# Patient Record
Sex: Female | Born: 1995 | Race: White | Hispanic: Yes | Marital: Single | Smoking: Never smoker
Health system: Southern US, Community
[De-identification: ages and names within clinical notes are randomized; demographics above are authoritative.]

---

## 2018-07-14 ENCOUNTER — Encounter: Payer: Self-pay | Admitting: Emergency Medicine

## 2018-07-14 ENCOUNTER — Other Ambulatory Visit: Payer: Self-pay

## 2018-07-14 ENCOUNTER — Emergency Department
Admission: EM | Admit: 2018-07-14 | Discharge: 2018-07-14 | Disposition: A | Discharge status: Home / Self Care | Payer: Commercial Managed Care - PPO | Attending: Emergency Medicine | Admitting: Emergency Medicine

## 2018-07-14 ENCOUNTER — Emergency Department: Payer: Commercial Managed Care - PPO

## 2018-07-14 DIAGNOSIS — Y92321 Football field as the place of occurrence of the external cause: Secondary | ICD-10-CM | POA: Insufficient documentation

## 2018-07-14 DIAGNOSIS — Y998 Other external cause status: Secondary | ICD-10-CM | POA: Insufficient documentation

## 2018-07-14 DIAGNOSIS — W03XXXA Other fall on same level due to collision with another person, initial encounter: Secondary | ICD-10-CM | POA: Insufficient documentation

## 2018-07-14 DIAGNOSIS — S93401A Sprain of unspecified ligament of right ankle, initial encounter: Secondary | ICD-10-CM | POA: Insufficient documentation

## 2018-07-14 DIAGNOSIS — S99911A Unspecified injury of right ankle, initial encounter: Secondary | ICD-10-CM | POA: Diagnosis present

## 2018-07-14 DIAGNOSIS — Y9362 Activity, american flag or touch football: Secondary | ICD-10-CM | POA: Diagnosis not present

## 2018-07-14 MED ORDER — HYDROCODONE-ACETAMINOPHEN 5-325 MG PO TABS
1.0000 | ORAL_TABLET | Freq: Once | ORAL | Status: AC
  Administered 2018-07-14: 1 via ORAL
  Filled 2018-07-14: qty 1

## 2018-07-14 MED ORDER — HYDROCODONE-ACETAMINOPHEN 5-325 MG PO TABS: 1.0000 | ORAL_TABLET | Freq: Four times a day (QID) | ORAL | 0 refills | Status: AC | PRN

## 2018-07-14 NOTE — ED Provider Notes (Signed)
[facility] Emergency Department Provider Note   ____________________________________________   First MD Initiated Contact with Patient 07/14/18 [phone]     (approximate)  I have reviewed the triage vital signs and the nursing notes.   HISTORY  Chief Complaint Ankle Pain    HPI Jade Wong is a 22 y.o. female who presents to the ED from [facility] with a chief complaint of right ankle pain and injury.  Patient was playing flag football when she got tackled from the side yesterday proximally 5 PM.  Larey Seat, injuring her right ankle.  Denies extremity weakness, numbness or tingling.  Also complains of right jaw pain.  Denies striking head or LOC.  Denies neck pain, chest pain, shortness of breath, abdominal pain, nausea or vomiting.  Denies anticoagulant use.   Past medical history None  There are no active problems to display for this patient.   History reviewed. No pertinent surgical history.  Prior to Admission medications   Medication Sig Start Date End Date Taking? Authorizing Provider  HYDROcodone-acetaminophen (NORCO) 5-325 MG tablet Take 1 tablet by mouth every 6 (six) hours as needed for moderate pain. 07/14/18   Irean Hong, MD    Allergies Nsaids  No family history on file.  Social History Social History   Tobacco Use  . Smoking status: Never Smoker  Substance Use Topics  . Alcohol use: Yes  . Drug use: Not Currently    Review of Systems  Constitutional: No fever/chills Eyes: No visual changes. ENT: Positive for right jaw pain.  No sore throat. Cardiovascular: Denies chest pain. Respiratory: Denies shortness of breath. Gastrointestinal: No abdominal pain.  No nausea, no vomiting.  No diarrhea.  No constipation. Genitourinary: Negative for dysuria. Musculoskeletal: Positive for right ankle pain and injury.  Negative for back pain. Skin: Negative for rash. Neurological: Negative for headaches, focal weakness or  numbness.   ____________________________________________   PHYSICAL EXAM:  VITAL SIGNS: ED Triage Vitals  Enc Vitals Group     BP 07/14/18 0207 126/66     Pulse Rate 07/14/18 0207 99     Resp 07/14/18 0207 20     Temp 07/14/18 0207 97.8 F (36.6 C)     Temp Source 07/14/18 0207 Oral     SpO2 07/14/18 0207 97 %     Weight 07/14/18 0208 140 lb (63.5 kg)     Height 07/14/18 0208 5' 8.5" (1.74 m)     Head Circumference --      Peak Flow --      Pain Score 07/14/18 0208 6     Pain Loc --      Pain Edu? --      Excl. in GC? --     Constitutional: Alert and oriented. Well appearing and in no acute distress.  Laughing with friend who is at bedside. Eyes: Conjunctivae are normal. PERRL. EOMI. Head: Atraumatic. Nose: Atraumatic. Mouth/Throat: Mucous membranes are moist.  No dental malocclusion.  No obvious jaw injury.  No trismus.  Patient is able to fully open and close her mouth. Neck: No stridor.  No cervical spine tenderness to palpation. Cardiovascular: Normal rate, regular rhythm. Grossly normal heart sounds.  Good peripheral circulation. Respiratory: Normal respiratory effort.  No retractions. Lungs CTAB. Gastrointestinal: Soft and nontender. No distention. No abdominal bruits. No CVA tenderness. Musculoskeletal:  Right ankle: Anterior tender to palpation.  Limited range of motion secondary to pain.  2+ distal pulses.  Brisk, less than 5-second capillary refill. Neurologic:  Normal speech and language. No gross focal neurologic deficits are appreciated.  Skin:  Skin is warm, dry and intact. No rash noted. Psychiatric: Mood and affect are normal. Speech and behavior are normal.  ____________________________________________   LABS (all labs ordered are listed, but only abnormal results are displayed)  Labs Reviewed - No data to display ____________________________________________  EKG  None ____________________________________________  RADIOLOGY  ED MD  interpretation: No acute traumatic injuries  Official radiology report(s): Dg Ankle Complete Right  Result Date: 07/14/2018 CLINICAL DATA:  Acute on set of medial right anterior ankle pain after tackling injury while playing football. Initial encounter. EXAM: RIGHT ANKLE - COMPLETE 3+ VIEW COMPARISON:  None. FINDINGS: There is no evidence of fracture or dislocation. The ankle mortise is intact; the interosseous space is within normal limits. No talar tilt or subluxation is seen. An os peroneum is noted. The joint spaces are preserved. No significant soft tissue abnormalities are seen. IMPRESSION: 1. No evidence of fracture or dislocation. 2. Os peroneum noted. Electronically Signed   By: Roanna Raider M.D.   On: 07/14/2018 02:49    ____________________________________________   PROCEDURES  Procedure(s) performed: None  Procedures  Critical Care performed: No  ____________________________________________   INITIAL IMPRESSION / ASSESSMENT AND PLAN / ED COURSE  As part of my medical decision making, I reviewed the following data within the electronic MEDICAL RECORD NUMBER Nursing notes reviewed and incorporated, Radiograph reviewed and Notes from prior ED visits   22 year old female who presents 10 hours status post after flag football injury with right ankle and jaw pain.  Clinically patient does not appear to have jaw fracture dislocation.  However, I did offer CT maxillofacial which patient declines.  Will administer Norco; pending x-ray results of right ankle.  Clinical Course as of Jul 15 427  Sat Jul 14, 2018  0254 Updated patient on x-ray result.  Will place in air splint, crutches, prescription for analgesia and orthopedics follow-up.  Strict return precautions given.  Patient verbalizes understanding and agrees with plan of care.   [JS]    Clinical Course User Index [JS] Irean Hong, MD     ____________________________________________   FINAL CLINICAL IMPRESSION(S) / ED  DIAGNOSES  Final diagnoses:  Sprain of right ankle, unspecified ligament, initial encounter     ED Discharge Orders         Ordered    HYDROcodone-acetaminophen (NORCO) 5-325 MG tablet  Every 6 hours PRN     07/14/18 0256           Note:  This document was prepared using Dragon voice recognition software and may include unintentional dictation errors.    Irean Hong, MD 07/14/18 [phone]

## 2018-07-14 NOTE — ED Triage Notes (Signed)
Pt reports about 5pm yesterday afternoon she was playing football and got tackled reports left ankle pain and jaw discomfort, pt talks in complete sentences no distress noted.

## 2018-07-14 NOTE — Discharge Instructions (Addendum)
1.  You may take Norco as needed for pain. 2.  Wear splint and crutches as needed for comfort.  Elevate affected ankle and apply ice several times daily over the weekend. 3.  Return to the ER for worsening symptoms, persistent vomiting, difficulty breathing or other concerns.

## 2020-06-02 IMAGING — DX DG ANKLE COMPLETE 3+V*R*
3 series · 3 of 3 positions shown · non-contrast
Comparison: None.

CLINICAL DATA: Acute on set of medial right anterior ankle pain
after tackling injury while playing football. Initial encounter.

EXAM:
RIGHT ANKLE - COMPLETE 3+ VIEW

[ankle ap]
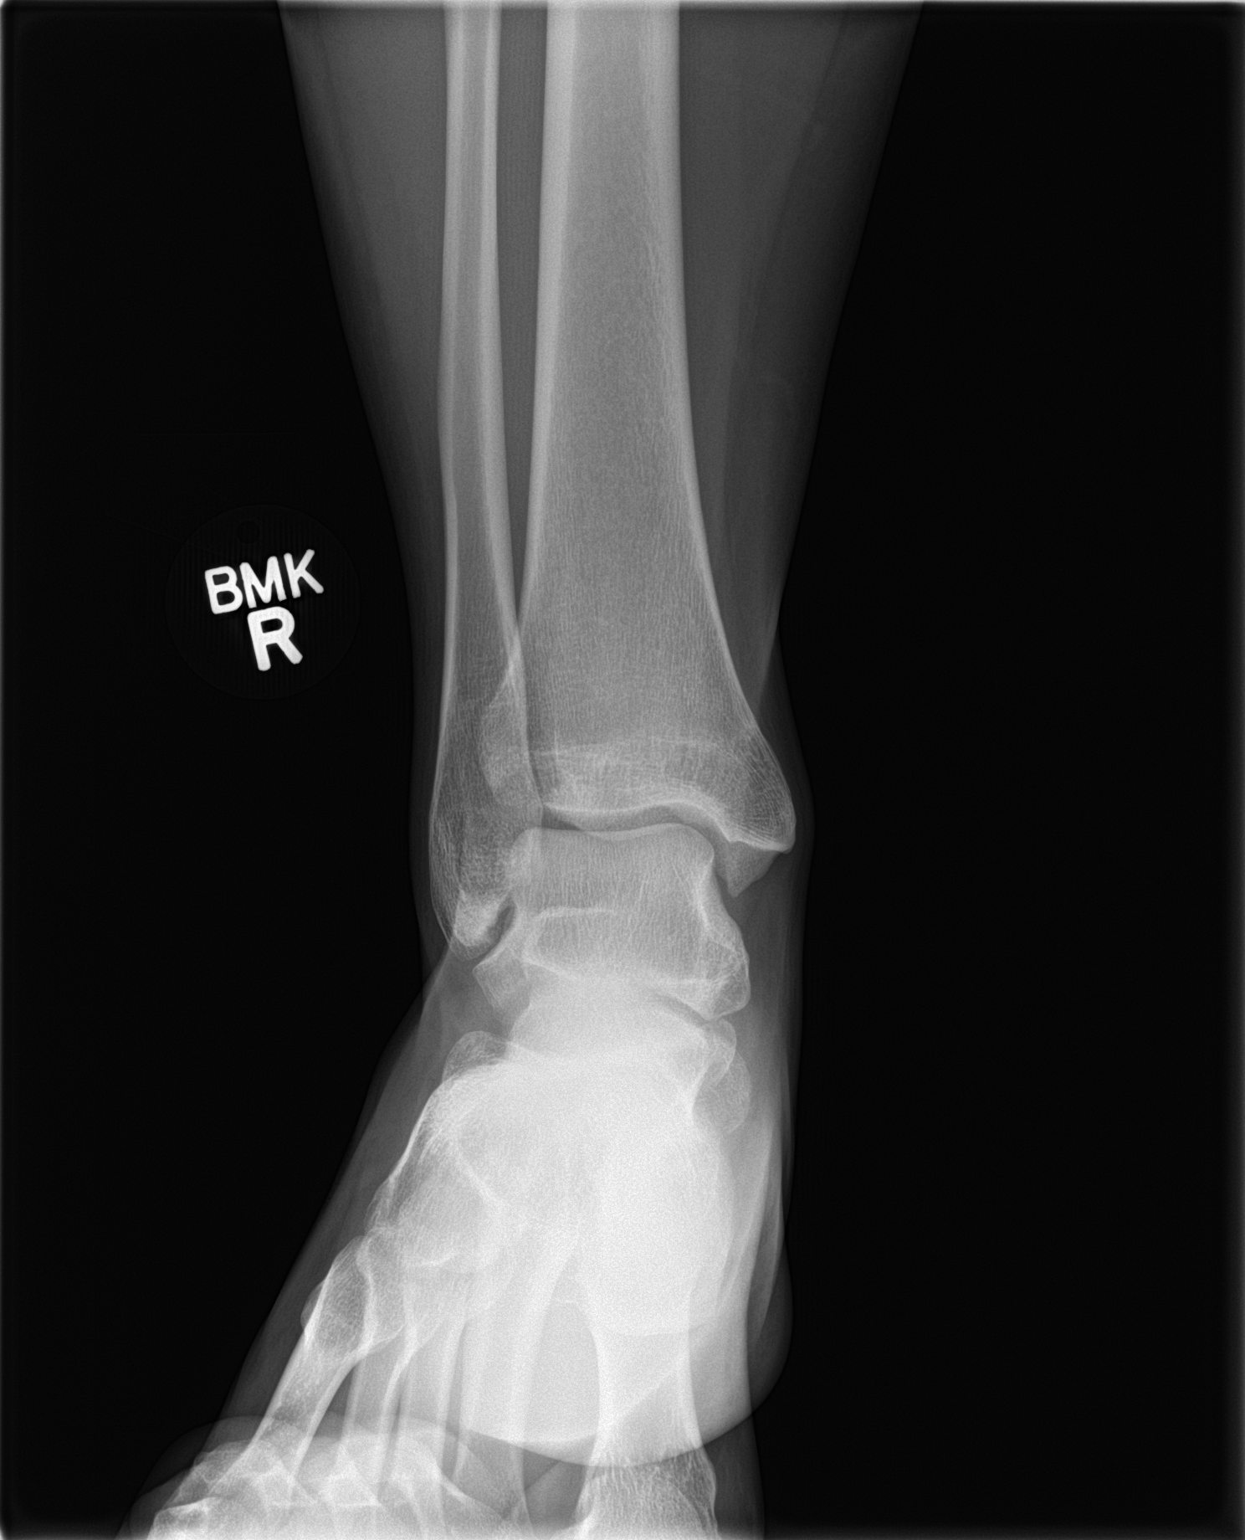

[ankle obl]
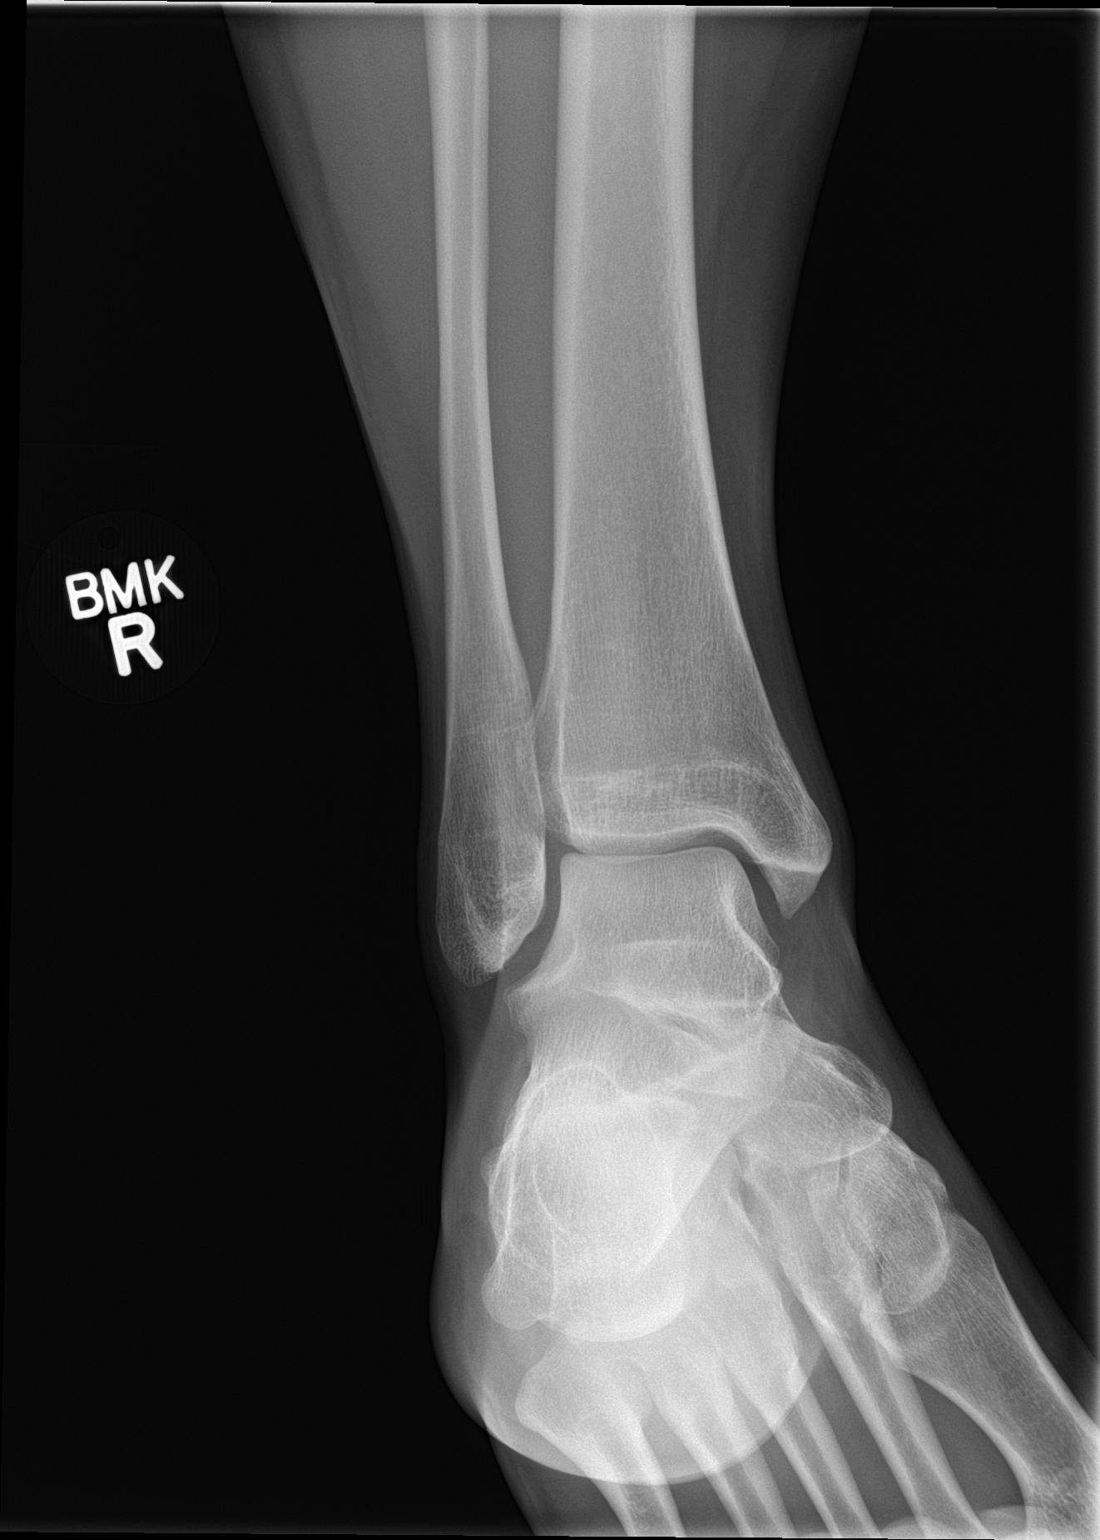

[ankle lat]
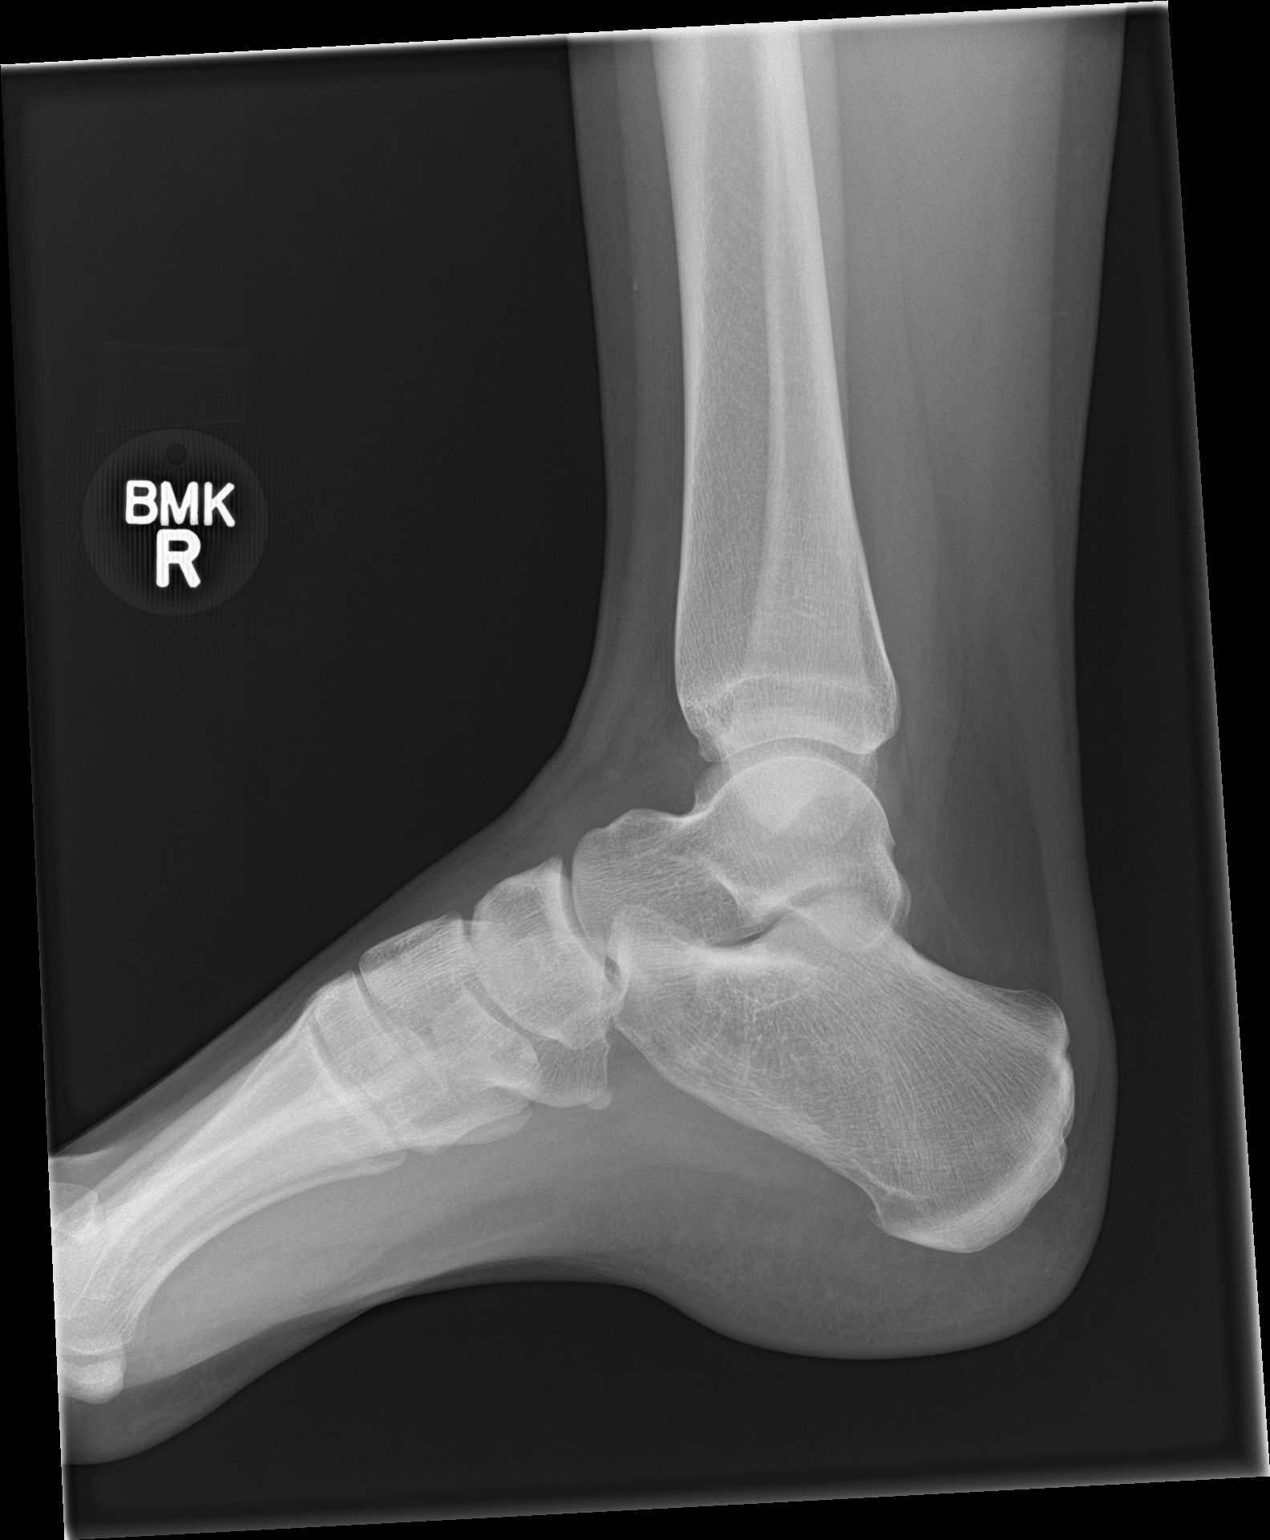

[3 of 3 positions shown; findings below may reference images not displayed]

FINDINGS: There is no evidence of fracture or dislocation. The ankle mortise
is intact; the interosseous space is within normal limits. No talar
tilt or subluxation is seen. An os peroneum is noted.

The joint spaces are preserved. No significant soft tissue
abnormalities are seen.
IMPRESSION: 1. No evidence of fracture or dislocation.
2. Os peroneum noted.
